# Patient Record
Sex: Male | Born: 2005 | Hispanic: Yes | Marital: Single | Smoking: Never smoker
Health system: Southern US, Community
[De-identification: ages and names within clinical notes are randomized; demographics above are authoritative.]

## PROBLEM LIST (undated history)

## (undated) DIAGNOSIS — R519 Headache, unspecified: Secondary | ICD-10-CM

## (undated) DIAGNOSIS — R51 Headache: Secondary | ICD-10-CM

## (undated) HISTORY — DX: Headache, unspecified: R51.9

## (undated) HISTORY — DX: Headache: R51

---
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
## 2023-01-25 ENCOUNTER — Ambulatory Visit
Admission: RE | Admit: 2023-01-25 | Discharge: 2023-01-25 | Disposition: A | Discharge status: Home / Self Care | Payer: Medicaid Other | Source: Ambulatory Visit | Attending: Nurse Practitioner | Admitting: Nurse Practitioner

## 2023-01-25 ENCOUNTER — Other Ambulatory Visit: Payer: Self-pay | Admitting: Nurse Practitioner

## 2023-01-25 DIAGNOSIS — R0789 Other chest pain: Secondary | ICD-10-CM

## 2023-01-25 DIAGNOSIS — R0602 Shortness of breath: Secondary | ICD-10-CM
